# Patient Record
Sex: Female | Born: 1960 | Race: White | Hispanic: No | Marital: Single | Smoking: Never smoker
Health system: Southern US, Community
[De-identification: ages and names within clinical notes are randomized; demographics above are authoritative.]

## PROBLEM LIST (undated history)

## (undated) DIAGNOSIS — I10 Essential (primary) hypertension: Secondary | ICD-10-CM

## (undated) DIAGNOSIS — G4700 Insomnia, unspecified: Secondary | ICD-10-CM

## (undated) DIAGNOSIS — C439 Malignant melanoma of skin, unspecified: Secondary | ICD-10-CM

## (undated) HISTORY — PX: CARDIAC SURGERY: SHX584

## (undated) HISTORY — PX: OTHER SURGICAL HISTORY: SHX169

---

## 2013-01-17 DIAGNOSIS — R000 Tachycardia, unspecified: Secondary | ICD-10-CM | POA: Insufficient documentation

## 2013-01-17 DIAGNOSIS — Z79899 Other long term (current) drug therapy: Secondary | ICD-10-CM | POA: Insufficient documentation

## 2013-01-17 DIAGNOSIS — G4700 Insomnia, unspecified: Secondary | ICD-10-CM | POA: Insufficient documentation

## 2013-01-17 DIAGNOSIS — I10 Essential (primary) hypertension: Secondary | ICD-10-CM | POA: Insufficient documentation

## 2013-01-17 DIAGNOSIS — I4891 Unspecified atrial fibrillation: Secondary | ICD-10-CM | POA: Insufficient documentation

## 2013-01-17 DIAGNOSIS — F411 Generalized anxiety disorder: Secondary | ICD-10-CM | POA: Insufficient documentation

## 2013-01-17 DIAGNOSIS — Z7982 Long term (current) use of aspirin: Secondary | ICD-10-CM | POA: Insufficient documentation

## 2013-01-18 ENCOUNTER — Other Ambulatory Visit: Payer: Self-pay

## 2013-01-18 ENCOUNTER — Emergency Department (HOSPITAL_COMMUNITY): Payer: BC Managed Care – PPO

## 2013-01-18 ENCOUNTER — Encounter (HOSPITAL_COMMUNITY): Payer: Self-pay | Admitting: Emergency Medicine

## 2013-01-18 ENCOUNTER — Emergency Department (HOSPITAL_COMMUNITY)
Admission: EM | Admit: 2013-01-18 | Discharge: 2013-01-18 | Disposition: A | Discharge status: Home / Self Care | Payer: BC Managed Care – PPO | Attending: Emergency Medicine | Admitting: Emergency Medicine

## 2013-01-18 DIAGNOSIS — I480 Paroxysmal atrial fibrillation: Secondary | ICD-10-CM

## 2013-01-18 HISTORY — DX: Insomnia, unspecified: G47.00

## 2013-01-18 HISTORY — DX: Essential (primary) hypertension: I10

## 2013-01-18 LAB — CBC
HCT: 44.8 % (ref 36.0–46.0)
MCH: 32.1 pg (ref 26.0–34.0)
MCV: 90.3 fL (ref 78.0–100.0)
RBC: 4.96 MIL/uL (ref 3.87–5.11)
RDW: 13.2 % (ref 11.5–15.5)
WBC: 8.3 10*3/uL (ref 4.0–10.5)

## 2013-01-18 LAB — BASIC METABOLIC PANEL
BUN: 13 mg/dL (ref 6–23)
CO2: 25 mEq/L (ref 19–32)
Calcium: 9.7 mg/dL (ref 8.4–10.5)
Chloride: 103 mEq/L (ref 96–112)
Creatinine, Ser: 0.64 mg/dL (ref 0.50–1.10)
GFR calc Af Amer: >90 mL/min (ref >90)

## 2013-01-18 MED ORDER — DILTIAZEM LOAD VIA INFUSION
10.0000 mg | Freq: Once | INTRAVENOUS | Status: AC
  Administered 2013-01-18: 10 mg via INTRAVENOUS
  Filled 2013-01-18: qty 10

## 2013-01-18 MED ORDER — DILTIAZEM HCL 30 MG PO TABS: 30.0000 mg | ORAL_TABLET | Freq: Three times a day (TID) | ORAL | Status: AC | PRN

## 2013-01-18 MED ORDER — DEXTROSE 5 % IV SOLN
5.0000 mg/h | INTRAVENOUS | Status: DC
  Administered 2013-01-18: 10 mg/h via INTRAVENOUS

## 2013-01-18 MED ORDER — SODIUM CHLORIDE 0.9 % IV BOLUS (SEPSIS)
1000.0000 mL | Freq: Once | INTRAVENOUS | Status: AC
  Administered 2013-01-18: 1000 mL via INTRAVENOUS

## 2013-01-18 NOTE — ED Provider Notes (Signed)
CSN: [id number]     Arrival date & time 01/17/13  2358 History   First MD Initiated Contact with Patient 01/18/13 0013     Chief Complaint  Patient presents with  . 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  About 90 minutes ago, patient was running to a code when she had onset of severe shortness of breath and palpitations.  Patient had heart rate in the 170s with PACs per the monitor at the hospital.  Patient did have some shortness of breath and dizziness, which has since resolved.  She denies any chest pain.  No prior history of palpitations or racing heart rate.  She reports both father and mother have history of age of fibrillation.  Patient reports over last 3 from, what she's had problems with increasing stress levels, insomnia, and depression, she works nights, and she feels these have led to her developing  A. fib. Past Medical History  Diagnosis Date  . Hypertension   . Insomnia    History reviewed. No pertinent past surgical history. No family history on file. History  Substance Use Topics  . Smoking status: Never Smoker   . Smokeless tobacco: Not on file  . Alcohol Use: Yes     Comment: wine   OB History   Grav Para Term Preterm Abortions TAB SAB Ect Mult Living                 Review of Systems  See History of Present Illness; otherwise all other systems are reviewed and negative Allergies  Review of patient's allergies indicates no known allergies.  Home Medications   Current Outpatient Rx  Name  Route  Sig  Dispense  Refill  . ALPRAZolam (XANAX) 0.5 MG tablet   Oral   Take 0.5 mg by mouth once.         Marland Kitchen aspirin 325 MG tablet   Oral   Take 325 mg by mouth daily.         Marland Kitchen lisinopril (PRINIVIL,ZESTRIL) 20 MG tablet   Oral   Take 20 mg by  mouth daily.         . Multiple Vitamin (MULTIVITAMIN WITH MINERALS) TABS tablet   Oral   Take 1 tablet by mouth daily.          BP 105/69  Pulse 112  Temp(Src) 97.6 F (36.4 C) (Oral)  Resp 20  SpO2 100%  LMP 01/11/2013 Physical Exam  Nursing note and vitals reviewed. Constitutional: She is oriented to person, place, and time. She appears well-developed and well-nourished. She appears distressed (anxious appearing).  HENT:  Head: Normocephalic and atraumatic.  Right Ear: External ear normal.  Left Ear: External ear normal.  Nose: Nose normal.  Mouth/Throat: Oropharynx is clear and moist.  Eyes: Conjunctivae and EOM are normal. Pupils are equal, round, and reactive to light.  Neck: Normal range of motion. Neck supple. No JVD present. No tracheal deviation present. No thyromegaly present.  Cardiovascular: Normal heart sounds and intact distal pulses.  Exam reveals no gallop and no friction rub.   No murmur heard. Tachycardia, irregular rate  Pulmonary/Chest: Effort normal and breath sounds normal. No stridor. No respiratory distress. She has no wheezes. She has no rales. She exhibits no tenderness.  Abdominal: Soft. Bowel sounds are normal. She exhibits no distension and no mass. There is no tenderness. There  is no rebound and no guarding.  Musculoskeletal: Normal range of motion. She exhibits no edema and no tenderness.  Lymphadenopathy:    She has no cervical adenopathy.  Neurological: She is alert and oriented to person, place, and time. She exhibits normal muscle tone. Coordination normal.  Skin: Skin is warm and dry. No rash noted. No erythema. No pallor.  Psychiatric: She has a normal mood and affect. Her behavior is normal. Judgment and thought content normal.    ED Course  Procedures (including critical care time)  CRITICAL CARE Performed by: Olivia Mackie Total critical care time: 45 min Critical care time was exclusive of separately billable procedures and  treating other patients. Critical care was necessary to treat or prevent imminent or life-threatening deterioration. Critical care was time spent personally by me on the following activities: development of treatment plan with patient and/or surrogate as well as nursing, discussions with consultants, evaluation of patient's response to treatment, examination of patient, obtaining history from patient or surrogate, ordering and performing treatments and interventions, ordering and review of laboratory studies, ordering and review of radiographic studies, pulse oximetry and re-evaluation of patient's condition.  Labs Review Labs Reviewed  CBC - Abnormal; Notable for the following:    Hemoglobin 15.9 (*)    All other components within normal limits  BASIC METABOLIC PANEL - Abnormal; Notable for the following:    Glucose, Bld 164 (*)    All other components within normal limits  POCT I-STAT TROPONIN I   Imaging Review Dg Chest Port 1 View  01/18/2013   CLINICAL DATA:  Hypertension, tachycardia, atrial fibrillation.  EXAM: PORTABLE CHEST - 1 VIEW  COMPARISON:  None available for comparison at time of study interpretation.  FINDINGS: Cardiomediastinal silhouette is unremarkable. The lungs are clear without pleural effusions or focal consolidations. Pulmonary vasculature is unremarkable. Trachea projects midline and there is no pneumothorax. Soft tissue planes and included osseous structures are nonsuspicious. Multiple EKG lines overlie the patient and may obscure subtle underlying pathology.  IMPRESSION: No active disease.   Electronically Signed   By: Awilda Metro   On: 01/18/2013 00:34    Date: 01/18/2013  Rate: 177  Rhythm: atrial fibrillation  QRS Axis: normal  Intervals: afib  ST/T Wave abnormalities: nonspecific ST changes  Conduction Disutrbances:afib  Narrative Interpretation:   Old EKG Reviewed: none available   Date: 01/18/2013  Rate: 85  Rhythm: normal sinus rhythm  QRS Axis:  normal  Intervals: normal  ST/T Wave abnormalities: normal  Conduction Disutrbances:none  Narrative Interpretation: afib with rvr resolved.  RSR in V1,V2  Old EKG Reviewed: changes noted     EKG Interpretation   None       MDM   1. Paroxysmal a-fib    52 year old female with A. fib with RVR.  We'll start diltiazem for rate control.  Will discuss with cardiology.  2:27 AM Pt has spontaneously converted to sinus.  Will consult cardiology regarding anticoagulation recommendations ([place] score 1), and close f/u .  Olivia Mackie, MD 01/18/13 [id number]

## 2013-01-18 NOTE — ED Notes (Addendum)
C/o heart racing x 1 1/2 hours.  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  Reports sob and dizziness that has resolved.  EKG- Afib with RVR

## 2013-01-18 NOTE — ED Notes (Signed)
MD at bedside. Seen by cardiology

## 2013-02-25 DIAGNOSIS — I480 Paroxysmal atrial fibrillation: Secondary | ICD-10-CM | POA: Insufficient documentation

## 2013-02-25 DIAGNOSIS — I10 Essential (primary) hypertension: Secondary | ICD-10-CM | POA: Insufficient documentation

## 2015-03-24 DIAGNOSIS — Z8679 Personal history of other diseases of the circulatory system: Secondary | ICD-10-CM | POA: Insufficient documentation

## 2015-04-10 DIAGNOSIS — F331 Major depressive disorder, recurrent, moderate: Secondary | ICD-10-CM | POA: Insufficient documentation

## 2015-04-10 DIAGNOSIS — G4720 Circadian rhythm sleep disorder, unspecified type: Secondary | ICD-10-CM | POA: Insufficient documentation

## 2015-04-10 DIAGNOSIS — N951 Menopausal and female climacteric states: Secondary | ICD-10-CM | POA: Insufficient documentation

## 2015-08-07 DIAGNOSIS — L719 Rosacea, unspecified: Secondary | ICD-10-CM | POA: Insufficient documentation

## 2016-02-12 DIAGNOSIS — C439 Malignant melanoma of skin, unspecified: Secondary | ICD-10-CM

## 2016-02-12 HISTORY — DX: Malignant melanoma of skin, unspecified: C43.9

## 2016-05-06 DIAGNOSIS — H02402 Unspecified ptosis of left eyelid: Secondary | ICD-10-CM | POA: Insufficient documentation

## 2016-05-06 DIAGNOSIS — G902 Horner's syndrome: Secondary | ICD-10-CM | POA: Insufficient documentation

## 2017-01-13 DIAGNOSIS — C4359 Malignant melanoma of other part of trunk: Secondary | ICD-10-CM | POA: Insufficient documentation

## 2017-08-01 ENCOUNTER — Other Ambulatory Visit: Payer: Self-pay | Admitting: Obstetrics & Gynecology

## 2017-08-01 DIAGNOSIS — Z1231 Encounter for screening mammogram for malignant neoplasm of breast: Secondary | ICD-10-CM

## 2017-08-22 ENCOUNTER — Ambulatory Visit
Admission: RE | Admit: 2017-08-22 | Discharge: 2017-08-22 | Disposition: A | Discharge status: Home / Self Care | Payer: BC Managed Care – PPO | Source: Ambulatory Visit | Attending: Obstetrics & Gynecology | Admitting: Obstetrics & Gynecology

## 2017-08-22 ENCOUNTER — Encounter: Payer: Self-pay | Admitting: Radiology

## 2017-08-22 DIAGNOSIS — Z1231 Encounter for screening mammogram for malignant neoplasm of breast: Secondary | ICD-10-CM | POA: Insufficient documentation

## 2017-08-22 HISTORY — DX: Malignant melanoma of skin, unspecified: C43.9

## 2018-07-27 ENCOUNTER — Other Ambulatory Visit: Payer: Self-pay | Admitting: Pediatrics

## 2018-07-27 ENCOUNTER — Other Ambulatory Visit: Payer: Self-pay | Admitting: Obstetrics & Gynecology

## 2018-07-27 DIAGNOSIS — Z1231 Encounter for screening mammogram for malignant neoplasm of breast: Secondary | ICD-10-CM

## 2018-10-29 ENCOUNTER — Ambulatory Visit
Admission: RE | Admit: 2018-10-29 | Discharge: 2018-10-29 | Disposition: A | Discharge status: Home / Self Care | Payer: BC Managed Care – PPO | Source: Ambulatory Visit | Attending: Obstetrics & Gynecology | Admitting: Obstetrics & Gynecology

## 2018-10-29 DIAGNOSIS — Z1231 Encounter for screening mammogram for malignant neoplasm of breast: Secondary | ICD-10-CM | POA: Insufficient documentation

## 2019-10-05 ENCOUNTER — Other Ambulatory Visit: Payer: Self-pay | Admitting: Obstetrics & Gynecology

## 2019-10-05 DIAGNOSIS — Z1231 Encounter for screening mammogram for malignant neoplasm of breast: Secondary | ICD-10-CM

## 2019-11-01 ENCOUNTER — Ambulatory Visit
Admission: RE | Admit: 2019-11-01 | Discharge: 2019-11-01 | Disposition: A | Discharge status: Home / Self Care | Payer: BC Managed Care – PPO | Source: Ambulatory Visit | Attending: Obstetrics & Gynecology | Admitting: Obstetrics & Gynecology

## 2019-11-01 ENCOUNTER — Other Ambulatory Visit: Payer: Self-pay

## 2019-11-01 DIAGNOSIS — Z1231 Encounter for screening mammogram for malignant neoplasm of breast: Secondary | ICD-10-CM

## 2020-01-25 DIAGNOSIS — F411 Generalized anxiety disorder: Secondary | ICD-10-CM | POA: Insufficient documentation

## 2020-07-17 ENCOUNTER — Other Ambulatory Visit: Payer: Self-pay

## 2020-07-17 ENCOUNTER — Ambulatory Visit (INDEPENDENT_AMBULATORY_CARE_PROVIDER_SITE_OTHER): Payer: Commercial Managed Care - PPO | Admitting: Nurse Practitioner

## 2020-07-17 ENCOUNTER — Encounter (INDEPENDENT_AMBULATORY_CARE_PROVIDER_SITE_OTHER): Payer: Self-pay | Admitting: Nurse Practitioner

## 2020-07-17 VITALS — BP 143/85 | HR 69 | Resp 16 | Wt 191.0 lb

## 2020-07-17 DIAGNOSIS — I8311 Varicose veins of right lower extremity with inflammation: Secondary | ICD-10-CM | POA: Diagnosis not present

## 2020-07-17 DIAGNOSIS — I8312 Varicose veins of left lower extremity with inflammation: Secondary | ICD-10-CM

## 2020-07-17 DIAGNOSIS — I10 Essential (primary) hypertension: Secondary | ICD-10-CM | POA: Diagnosis not present

## 2020-07-25 ENCOUNTER — Other Ambulatory Visit (INDEPENDENT_AMBULATORY_CARE_PROVIDER_SITE_OTHER): Payer: Self-pay | Admitting: Nurse Practitioner

## 2020-07-25 ENCOUNTER — Encounter (INDEPENDENT_AMBULATORY_CARE_PROVIDER_SITE_OTHER): Payer: Self-pay | Admitting: Nurse Practitioner

## 2020-07-25 DIAGNOSIS — R600 Localized edema: Secondary | ICD-10-CM

## 2020-07-26 ENCOUNTER — Ambulatory Visit (INDEPENDENT_AMBULATORY_CARE_PROVIDER_SITE_OTHER): Payer: Commercial Managed Care - PPO | Admitting: Nurse Practitioner

## 2020-07-26 ENCOUNTER — Other Ambulatory Visit: Payer: Self-pay

## 2020-07-26 ENCOUNTER — Ambulatory Visit (INDEPENDENT_AMBULATORY_CARE_PROVIDER_SITE_OTHER): Payer: Commercial Managed Care - PPO

## 2020-07-26 ENCOUNTER — Encounter (INDEPENDENT_AMBULATORY_CARE_PROVIDER_SITE_OTHER): Payer: Self-pay | Admitting: Nurse Practitioner

## 2020-07-26 VITALS — BP 144/79 | HR 68 | Resp 16 | Wt 185.0 lb

## 2020-07-26 DIAGNOSIS — R600 Localized edema: Secondary | ICD-10-CM | POA: Diagnosis not present

## 2020-07-26 DIAGNOSIS — I8312 Varicose veins of left lower extremity with inflammation: Secondary | ICD-10-CM

## 2020-07-26 DIAGNOSIS — I10 Essential (primary) hypertension: Secondary | ICD-10-CM | POA: Diagnosis not present

## 2020-07-26 DIAGNOSIS — I8311 Varicose veins of right lower extremity with inflammation: Secondary | ICD-10-CM

## 2020-07-26 NOTE — Progress Notes (Signed)
Subjective:    Patient ID: Amber Peters, female    DOB: 10/31/1960, 60 y.o.   MRN: [id number] Chief Complaint  Patient presents with   New Patient (Initial Visit)    Ref White ble edema     The patient is seen for evaluation of symptomatic varicose veins.  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  The patient relates burning and stinging which worsened steadily throughout the course of the day, particularly with standing. The patient also notes an aching and throbbing pain over the varicosities, particularly with prolonged dependent positions. The symptoms are significantly improved with elevation.  The patient also notes that during hot weather the symptoms are greatly intensified. The patient states the pain from the varicose veins interferes with work, daily exercise, shopping and household maintenance. At this point, the symptoms are persistent and severe enough that they're having a negative impact on lifestyle and are interfering with daily activities.  There is no history of DVT, PE or superficial thrombophlebitis. There is no history of ulceration or hemorrhage. The patient denies a significant family history of varicose veins.   The patient has worn graduated compression for years and continues to use them nightly. At the present time the patient has been using over-the-counter analgesics. There is no history of prior surgical intervention or sclerotherapy.      Review of Systems  Cardiovascular:  Positive for leg swelling.  All other systems reviewed and are negative.     Objective:   Physical Exam Vitals reviewed.  HENT:     Head: Normocephalic.  Cardiovascular:     Rate and Rhythm: Normal rate.     Pulses: Normal pulses.  Pulmonary:     Effort: Pulmonary effort is normal.  Musculoskeletal:        General: No tenderness.  Skin:    General: Skin is warm and dry.  Neurological:     Mental Status: She is alert and  oriented to person, place, and time.  Psychiatric:        Mood and Affect: Mood normal.        Behavior: Behavior normal.        Thought Content: Thought content normal.        Judgment: Judgment normal.    BP (!) 143/85 (BP Location: Right Arm)   Pulse 69   Resp 16   Wt 191 lb (86.6 kg)   LMP 01/11/2013   Past Medical History:  Diagnosis Date   Hypertension    Insomnia    Melanoma ([place]) 2018   melanoma    Social History   Socioeconomic History   Marital status: Single    Spouse name: Not on file   Number of children: Not on file   Years of education: Not on file   Highest education level: Not on file  Occupational History   Not on file  Tobacco Use   Smoking status: Never   Smokeless tobacco: Never  Substance and Sexual Activity   Alcohol use: Yes    Comment: wine   Drug use: No   Sexual activity: Not on file  Other Topics Concern   Not on file  Social History Narrative   Not on file   Social Determinants of Health   Financial Resource Strain: Not on file  Food Insecurity: Not on file  Transportation Needs: Not on file  Physical Activity: Not on file  Stress: Not on file  Social Connections: Not on file  Intimate Partner Violence: Not on file    Past Surgical History:  Procedure Laterality Date   CARDIAC SURGERY     cardiac ablation   myeloma     removal    Family History  Problem Relation Age of Onset   Stroke Mother    Parkinson's disease Mother    Hypertension Father    Varicose Veins Maternal Grandmother     Allergies  Allergen Reactions   Amoxicillin Anaphylaxis    CBC Latest Ref Rng & Units 01/18/2013  WBC 4.0 - 10.5 K/uL 8.3  Hemoglobin 12.0 - 15.0 g/dL 15.9(H)  Hematocrit 36.0 - 46.0 % 44.8  Platelets 150 - 400 K/uL 262      CMP     Component Value Date/Time   NA 139 01/18/2013 0020   K 4.1 01/18/2013 0020   CL 103 01/18/2013 0020   CO2 25 01/18/2013 0020   GLUCOSE 164 (H) 01/18/2013 0020   BUN 13 01/18/2013 0020    CREATININE 0.64 01/18/2013 0020   CALCIUM 9.7 01/18/2013 0020   GFRNONAA >90 01/18/2013 0020   GFRAA >90 01/18/2013 0020     No results found.     Assessment & Plan:   1. Varicose veins of both lower extremities with inflammation  Recommend:  The patient has large symptomatic varicose veins that are painful and associated with swelling.  I have had a long discussion with the patient regarding  varicose veins and why they cause symptoms.  Patient will continue wearing graduated compression stockings class 1 on a daily basis, beginning first thing in the morning and removing them in the evening. The patient is instructed specifically not to sleep in the stockings.    The patient  will also continue using over-the-counter analgesics such as Motrin 600 mg po TID to help control the symptoms.    In addition, behavioral modification including elevation during the day will be continued.    An ultrasound will be obtained  Further plans will be based on the ultrasound results and whether conservative therapies are successful at eliminating the pain and swelling.    2. Essential hypertension Continue antihypertensive medications as already ordered, these medications have been reviewed and there are no changes at this time.    Current Outpatient Medications on File Prior to Visit  Medication Sig Dispense Refill   Aspirin 81 MG CAPS Take 81 mg by mouth daily.     diltiazem (TIAZAC) 240 MG 24 hr capsule Take 1 capsule by mouth daily.     DULoxetine (CYMBALTA) 60 MG capsule Take 1 capsule by mouth daily.     Multiple Vitamin (MULTIVITAMIN WITH MINERALS) TABS tablet Take 1 tablet by mouth daily.     traZODone (DESYREL) 100 MG tablet Take 200 mg by mouth at bedtime.     ALPRAZolam (XANAX) 0.5 MG tablet Take 0.5 mg by mouth once. (Patient not taking: Reported on 07/17/2020)     diltiazem (CARDIZEM) 30 MG tablet Take 1 tablet (30 mg total) by mouth every 8 (eight) hours as needed (palpitations).  (Patient not taking: No sig reported) 30 tablet 0   lisinopril (PRINIVIL,ZESTRIL) 20 MG tablet Take 20 mg by mouth daily. (Patient not taking: No sig reported)     No current facility-administered medications on file prior to visit.    There are no Patient Instructions on file for this visit. No follow-ups on file.   Kris Hartmann, NP

## 2020-07-31 NOTE — Progress Notes (Signed)
Subjective:    Patient ID: Amber Peters, female    DOB: 12-18-1960, 60 y.o.   MRN: [id number] Chief Complaint  Patient presents with   Follow-up    Ultrasound follow up    Amber Peters is a 60 year old female that returns today for noninvasive studies.  The patient continues to have pain in the lower extremities with dependency. The pain is lessened with elevation. Graduated compression stockings, Class I (20-30 mmHg), have been worn but the stockings do not eliminate the leg pain.  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  These are no longer controlling her symptoms.  Over-the-counter analgesics do not improve the symptoms. The degree of discomfort continues to interfere with daily activities. The patient notes the pain in the legs is causing problems with daily exercise, at the workplace and even with household activities and maintenance such as standing in the kitchen preparing meals and doing dishes.   Venous ultrasound shows deep venous insufficiency in the left lower extremity no evidence of acute or chronic DVT.  Superficial reflux is present in the right great saphenous vein as well as the left great saphenous vein   Review of Systems  Cardiovascular:  Positive for leg swelling.  All other systems reviewed and are negative.     Objective:   Physical Exam Vitals reviewed.  HENT:     Head: Normocephalic.  Cardiovascular:     Rate and Rhythm: Normal rate.     Pulses: Normal pulses.  Pulmonary:     Effort: Pulmonary effort is normal.  Musculoskeletal:        General: Tenderness present.  Skin:    General: Skin is warm and dry.     Comments: Large palpable right lower extremity varicosity  Neurological:     Mental Status: She is alert and oriented to person, place, and time.  Psychiatric:        Mood and Affect: Mood normal.        Behavior: Behavior normal.        Thought  Content: Thought content normal.        Judgment: Judgment normal.    BP (!) 144/79 (BP Location: Right Arm)   Pulse 68   Resp 16   Wt 185 lb (83.9 kg)   LMP 01/11/2013   Past Medical History:  Diagnosis Date   Hypertension    Insomnia    Melanoma ([place]) 2018   melanoma    Social History   Socioeconomic History   Marital status: Single    Spouse name: Not on file   Number of children: Not on file   Years of education: Not on file   Highest education level: Not on file  Occupational History   Not on file  Tobacco Use   Smoking status: Never   Smokeless tobacco: Never  Substance and Sexual Activity   Alcohol use: Yes    Comment: wine   Drug use: No   Sexual activity: Not on file  Other Topics Concern   Not on file  Social History Narrative   Not on file   Social Determinants of Health   Financial Resource Strain: Not on file  Food Insecurity: Not on file  Transportation Needs: Not on file  Physical Activity: Not on file  Stress: Not on file  Social Connections: Not on file  Intimate Partner Violence: Not on file    Past Surgical History:  Procedure  Laterality Date   CARDIAC SURGERY     cardiac ablation   myeloma     removal    Family History  Problem Relation Age of Onset   Stroke Mother    Parkinson's disease Mother    Hypertension Father    Varicose Veins Maternal Grandmother     Allergies  Allergen Reactions   Amoxicillin Anaphylaxis    CBC Latest Ref Rng & Units 01/18/2013  WBC 4.0 - 10.5 K/uL 8.3  Hemoglobin 12.0 - 15.0 g/dL 15.9(H)  Hematocrit 36.0 - 46.0 % 44.8  Platelets 150 - 400 K/uL 262      CMP     Component Value Date/Time   NA 139 01/18/2013 0020   K 4.1 01/18/2013 0020   CL 103 01/18/2013 0020   CO2 25 01/18/2013 0020   GLUCOSE 164 (H) 01/18/2013 0020   BUN 13 01/18/2013 0020   CREATININE 0.64 01/18/2013 0020   CALCIUM 9.7 01/18/2013 0020   GFRNONAA >90 01/18/2013 0020   GFRAA >90 01/18/2013 0020     No  results found.     Assessment & Plan:   1. Varicose veins of both lower extremities with inflammation Recommend  I have reviewed my previous  discussion with the patient regarding  varicose veins and why they cause symptoms. Patient will continue  wearing graduated compression stockings class 1 on a daily basis, beginning first thing in the morning and removing them in the evening.    In addition, behavioral modification including elevation during the day was again discussed and this will continue.  The patient has utilized over the counter pain medications and has been exercising.  However, at this time conservative therapy has not alleviated the patient's symptoms of leg pain and swelling  Recommend: laser ablation of the right and  left great saphenous veins to eliminate the symptoms of pain and swelling of the lower extremities caused by the severe superficial venous reflux disease.    2. Essential hypertension Continue antihypertensive medications as already ordered, these medications have been reviewed and there are no changes at this time.    Current Outpatient Medications on File Prior to Visit  Medication Sig Dispense Refill   Aspirin 81 MG CAPS Take 81 mg by mouth daily.     diltiazem (TIAZAC) 240 MG 24 hr capsule Take 1 capsule by mouth daily.     DULoxetine (CYMBALTA) 60 MG capsule Take 1 capsule by mouth daily.     Multiple Vitamin (MULTIVITAMIN WITH MINERALS) TABS tablet Take 1 tablet by mouth daily.     traZODone (DESYREL) 100 MG tablet Take 200 mg by mouth at bedtime.     ALPRAZolam (XANAX) 0.5 MG tablet Take 0.5 mg by mouth once. (Patient not taking: No sig reported)     diltiazem (CARDIZEM) 30 MG tablet Take 1 tablet (30 mg total) by mouth every 8 (eight) hours as needed (palpitations). (Patient not taking: No sig reported) 30 tablet 0   lisinopril (PRINIVIL,ZESTRIL) 20 MG tablet Take 20 mg by mouth daily. (Patient not taking: No sig reported)     No current  facility-administered medications on file prior to visit.    There are no Patient Instructions on file for this visit. No follow-ups on file.   Kris Hartmann, NP

## 2020-08-01 ENCOUNTER — Encounter (INDEPENDENT_AMBULATORY_CARE_PROVIDER_SITE_OTHER): Payer: Self-pay | Admitting: Nurse Practitioner

## 2020-10-30 ENCOUNTER — Other Ambulatory Visit (INDEPENDENT_AMBULATORY_CARE_PROVIDER_SITE_OTHER): Payer: Self-pay | Admitting: Nurse Practitioner

## 2020-10-30 MED ORDER — ALPRAZOLAM 0.5 MG PO TABS: 0.5000 mg | ORAL_TABLET | Freq: Two times a day (BID) | ORAL | 0 refills | Status: AC

## 2020-10-31 ENCOUNTER — Other Ambulatory Visit: Payer: Self-pay

## 2020-10-31 ENCOUNTER — Ambulatory Visit (INDEPENDENT_AMBULATORY_CARE_PROVIDER_SITE_OTHER): Payer: Commercial Managed Care - PPO | Admitting: Vascular Surgery

## 2020-10-31 ENCOUNTER — Encounter (INDEPENDENT_AMBULATORY_CARE_PROVIDER_SITE_OTHER): Payer: Self-pay | Admitting: Vascular Surgery

## 2020-10-31 DIAGNOSIS — I83813 Varicose veins of bilateral lower extremities with pain: Secondary | ICD-10-CM

## 2020-10-31 NOTE — Progress Notes (Signed)
  Amber Peters is a 60 y.o. female who presents with symptomatic venous reflux  Past Medical History:  Diagnosis Date   Hypertension    Insomnia    Melanoma ([place]) 2018   melanoma    Past Surgical History:  Procedure Laterality Date   CARDIAC SURGERY     cardiac ablation   myeloma     removal     Current Outpatient Medications:    ALPRAZolam (XANAX) 0.5 MG tablet, Take 1 tablet (0.5 mg total) by mouth 2 (two) times daily. Take 1 tablet one hour prior to appointment time.  Take second tablet when you arrive in office, Disp: 2 tablet, Rfl: 0   Aspirin 81 MG CAPS, Take 81 mg by mouth daily., Disp: , Rfl:    diltiazem (CARDIZEM) 30 MG tablet, Take 1 tablet (30 mg total) by mouth every 8 (eight) hours as needed (palpitations)., Disp: 30 tablet, Rfl: 0   diltiazem (TIAZAC) 240 MG 24 hr capsule, Take 1 capsule by mouth daily., Disp: , Rfl:    DULoxetine (CYMBALTA) 60 MG capsule, Take 1 capsule by mouth daily., Disp: , Rfl:    Multiple Vitamin (MULTIVITAMIN WITH MINERALS) TABS tablet, Take 1 tablet by mouth daily., Disp: , Rfl:    traZODone (DESYREL) 100 MG tablet, Take 200 mg by mouth at bedtime., Disp: , Rfl:    lisinopril (PRINIVIL,ZESTRIL) 20 MG tablet, Take 20 mg by mouth daily. (Patient not taking: No sig reported), Disp: , Rfl:   Allergies  Allergen Reactions   Amoxicillin Anaphylaxis     Varicose veins of leg with pain, bilateral     PLAN: The patient's right lower extremity was sterilely prepped and draped. The ultrasound machine was used to visualize the saphenous vein throughout its course. A segment just below the knee was selected for access. The saphenous vein was accessed without difficulty using ultrasound guidance with a micropuncture needle. A 0.018 wire was then placed beyond the saphenofemoral junction and the needle was removed. The 65 cm sheath was then placed over the wire and the wire and dilator were removed. The laser fiber was then placed through the sheath  and its tip was placed approximately 4-5 centimeters below the saphenofemoral junction. Tumescent anesthesia was then created with a dilute lidocaine solution. Laser energy was then delivered with constant withdrawal of the sheath and laser fiber. Approximately 1266 joules of energy were delivered over a length of 31 centimeters using a 1470 Hz VenaCure machine at 7 W. Sterile dressings were placed. The patient tolerated the procedure well without obvious complications.   Follow-up in 1 week with post-laser duplex.

## 2020-11-03 ENCOUNTER — Telehealth (INDEPENDENT_AMBULATORY_CARE_PROVIDER_SITE_OTHER): Payer: Self-pay

## 2020-11-03 NOTE — Telephone Encounter (Signed)
Pt called and wants to know how long since her laser ablation done one Tuesday last week will the vein in her leg reseed she is having a lot of pain I spoke to the pt and made her aware that per Dr. Lucky Cowboy this can last about 3-4 week and is to be expected  and that a knot and tenderness is normal . I also made the pt aware that compression stocking to the knee were ok that to the thigh were no necessary .

## 2020-11-07 ENCOUNTER — Ambulatory Visit (INDEPENDENT_AMBULATORY_CARE_PROVIDER_SITE_OTHER): Payer: Commercial Managed Care - PPO

## 2020-11-07 ENCOUNTER — Other Ambulatory Visit: Payer: Self-pay

## 2020-11-07 DIAGNOSIS — I83813 Varicose veins of bilateral lower extremities with pain: Secondary | ICD-10-CM

## 2020-11-21 ENCOUNTER — Encounter (INDEPENDENT_AMBULATORY_CARE_PROVIDER_SITE_OTHER): Payer: Self-pay | Admitting: Nurse Practitioner

## 2020-11-21 ENCOUNTER — Ambulatory Visit (INDEPENDENT_AMBULATORY_CARE_PROVIDER_SITE_OTHER): Payer: Commercial Managed Care - PPO | Admitting: Nurse Practitioner

## 2020-11-21 ENCOUNTER — Other Ambulatory Visit: Payer: Self-pay

## 2020-11-21 ENCOUNTER — Ambulatory Visit (INDEPENDENT_AMBULATORY_CARE_PROVIDER_SITE_OTHER): Payer: Commercial Managed Care - PPO | Admitting: Vascular Surgery

## 2020-11-21 VITALS — BP 155/85 | HR 69 | Resp 16 | Wt 186.4 lb

## 2020-11-21 DIAGNOSIS — I83813 Varicose veins of bilateral lower extremities with pain: Secondary | ICD-10-CM | POA: Diagnosis not present

## 2020-11-21 DIAGNOSIS — I10 Essential (primary) hypertension: Secondary | ICD-10-CM

## 2020-11-22 ENCOUNTER — Telehealth (INDEPENDENT_AMBULATORY_CARE_PROVIDER_SITE_OTHER): Payer: Self-pay | Admitting: Vascular Surgery

## 2020-11-22 NOTE — Telephone Encounter (Signed)
documentation

## 2020-11-29 ENCOUNTER — Encounter (INDEPENDENT_AMBULATORY_CARE_PROVIDER_SITE_OTHER): Payer: Self-pay | Admitting: Nurse Practitioner

## 2020-11-29 NOTE — Progress Notes (Signed)
Subjective:    Patient ID: Amber Peters, female    DOB: [birth year], 60 y.o.   MRN: [id number] Chief Complaint  Patient presents with   Follow-up    4 wk post laser follow up    The patient returns to the office for followup status post laser ablation of the right great saphenous vein on 10/31/2020. The patient notes multiple residual varicosities bilaterally which continued to hurt with dependent positions and remained tender to palpation. The patient's swelling is unchanged from preoperative status. The patient continues to wear graduated compression stockings on a daily basis but these are not eliminating the pain and discomfort. The patient continues to use over-the-counter anti-inflammatory medications to treat the pain and related symptoms but this has not given the patient relief. The patient notes the pain in the lower extremities is causing problems with daily exercise, problems at work and even with household activities such as preparing meals and doing dishes.  The patient is otherwise done well and there have been no complications related to the laser procedure or interval changes in the patient's overall   Venous ultrasound post laser shows successful laser ablation of the right great saphenous vein, no DVT identified.    Review of Systems  Hematological:  Bruises/bleeds easily.  All other systems reviewed and are negative.     Objective:   Physical Exam Vitals reviewed.  HENT:     Head: Normocephalic.  Cardiovascular:     Rate and Rhythm: Normal rate.  Pulmonary:     Effort: Pulmonary effort is normal.  Musculoskeletal:        General: Tenderness present.  Skin:    General: Skin is warm and dry.  Neurological:     Mental Status: She is alert and oriented to person, place, and time.  Psychiatric:        Mood and Affect: Mood normal.        Behavior: Behavior normal.        Thought Content: Thought content normal.        Judgment: Judgment normal.    BP (!)  155/85 (BP Location: Right Arm)   Pulse 69   Resp 16   Wt 186 lb 6.4 oz (84.6 kg)   LMP 01/11/2013   Past Medical History:  Diagnosis Date   Hypertension    Insomnia    Melanoma ([place]) 2018   melanoma    Social History   Socioeconomic History   Marital status: Single    Spouse name: Not on file   Number of children: Not on file   Years of education: Not on file   Highest education level: Not on file  Occupational History   Not on file  Tobacco Use   Smoking status: Never   Smokeless tobacco: Never  Substance and Sexual Activity   Alcohol use: Yes    Comment: wine   Drug use: No   Sexual activity: Not on file  Other Topics Concern   Not on file  Social History Narrative   Not on file   Social Determinants of Health   Financial Resource Strain: Not on file  Food Insecurity: Not on file  Transportation Needs: Not on file  Physical Activity: Not on file  Stress: Not on file  Social Connections: Not on file  Intimate Partner Violence: Not on file    Past Surgical History:  Procedure Laterality Date   CARDIAC SURGERY     cardiac ablation   myeloma     removal  Family History  Problem Relation Age of Onset   Stroke Mother    Parkinson's disease Mother    Hypertension Father    Varicose Veins Maternal Grandmother     Allergies  Allergen Reactions   Amoxicillin Anaphylaxis    CBC Latest Ref Rng & Units 01/18/2013  WBC 4.0 - 10.5 K/uL 8.3  Hemoglobin 12.0 - 15.0 g/dL 15.9(H)  Hematocrit 36.0 - 46.0 % 44.8  Platelets 150 - 400 K/uL 262      CMP     Component Value Date/Time   NA 139 01/18/2013 0020   K 4.1 01/18/2013 0020   CL 103 01/18/2013 0020   CO2 25 01/18/2013 0020   GLUCOSE 164 (H) 01/18/2013 0020   BUN 13 01/18/2013 0020   CREATININE 0.64 01/18/2013 0020   CALCIUM 9.7 01/18/2013 0020   GFRNONAA >90 01/18/2013 0020   GFRAA >90 01/18/2013 0020     No results found.     Assessment & Plan:   1. Varicose veins of leg with  pain, bilateral The patient still has approval for a left lower extremity endovenous laser ablation.  We will proceed with getting that scheduled.  Following ablation above we will proceed with evaluation for possible sclerotherapy for both lower extremities.  2. Essential hypertension Continue antihypertensive medications as already ordered, these medications have been reviewed and there are no changes at this time.    Current Outpatient Medications on File Prior to Visit  Medication Sig Dispense Refill   diltiazem (CARDIZEM) 30 MG tablet Take 1 tablet (30 mg total) by mouth every 8 (eight) hours as needed (palpitations). 30 tablet 0   Multiple Vitamin (MULTIVITAMIN WITH MINERALS) TABS tablet Take 1 tablet by mouth daily.     traZODone (DESYREL) 100 MG tablet Take 200 mg by mouth at bedtime.     ALPRAZolam (XANAX) 0.5 MG tablet Take 1 tablet (0.5 mg total) by mouth 2 (two) times daily. Take 1 tablet one hour prior to appointment time.  Take second tablet when you arrive in office (Patient not taking: Reported on 11/21/2020) 2 tablet 0   Aspirin 81 MG CAPS Take 81 mg by mouth daily. (Patient not taking: Reported on 11/21/2020)     diltiazem (TIAZAC) 240 MG 24 hr capsule Take 1 capsule by mouth daily. (Patient not taking: Reported on 11/21/2020)     DULoxetine (CYMBALTA) 60 MG capsule Take 1 capsule by mouth daily. (Patient not taking: Reported on 11/21/2020)     lisinopril (PRINIVIL,ZESTRIL) 20 MG tablet Take 20 mg by mouth daily. (Patient not taking: No sig reported)     No current facility-administered medications on file prior to visit.    There are no Patient Instructions on file for this visit. No follow-ups on file.   Kris Hartmann, NP

## 2020-12-15 ENCOUNTER — Encounter (INDEPENDENT_AMBULATORY_CARE_PROVIDER_SITE_OTHER): Payer: Self-pay | Admitting: Vascular Surgery

## 2020-12-15 ENCOUNTER — Other Ambulatory Visit: Payer: Self-pay

## 2020-12-15 ENCOUNTER — Ambulatory Visit (INDEPENDENT_AMBULATORY_CARE_PROVIDER_SITE_OTHER): Payer: Commercial Managed Care - PPO | Admitting: Vascular Surgery

## 2020-12-15 VITALS — BP 138/83 | HR 76 | Ht 66.0 in | Wt 190.0 lb

## 2020-12-15 DIAGNOSIS — I83813 Varicose veins of bilateral lower extremities with pain: Secondary | ICD-10-CM | POA: Diagnosis not present

## 2020-12-15 NOTE — Progress Notes (Signed)
  Amber Peters is a 60 y.o. female who presents with symptomatic venous reflux  Past Medical History:  Diagnosis Date   Hypertension    Insomnia    Melanoma ([place]) 2018   melanoma    Past Surgical History:  Procedure Laterality Date   CARDIAC SURGERY     cardiac ablation   myeloma     removal     Current Outpatient Medications:    ALPRAZolam (XANAX) 0.5 MG tablet, Take 1 tablet (0.5 mg total) by mouth 2 (two) times daily. Take 1 tablet one hour prior to appointment time.  Take second tablet when you arrive in office (Patient not taking: Reported on 11/21/2020), Disp: 2 tablet, Rfl: 0   Aspirin 81 MG CAPS, Take 81 mg by mouth daily. (Patient not taking: Reported on 11/21/2020), Disp: , Rfl:    diltiazem (CARDIZEM) 30 MG tablet, Take 1 tablet (30 mg total) by mouth every 8 (eight) hours as needed (palpitations)., Disp: 30 tablet, Rfl: 0   diltiazem (TIAZAC) 240 MG 24 hr capsule, Take 1 capsule by mouth daily. (Patient not taking: Reported on 11/21/2020), Disp: , Rfl:    DULoxetine (CYMBALTA) 60 MG capsule, Take 1 capsule by mouth daily. (Patient not taking: Reported on 11/21/2020), Disp: , Rfl:    lisinopril (PRINIVIL,ZESTRIL) 20 MG tablet, Take 20 mg by mouth daily. (Patient not taking: No sig reported), Disp: , Rfl:    Multiple Vitamin (MULTIVITAMIN WITH MINERALS) TABS tablet, Take 1 tablet by mouth daily., Disp: , Rfl:    traZODone (DESYREL) 100 MG tablet, Take 200 mg by mouth at bedtime., Disp: , Rfl:   Allergies  Allergen Reactions   Amoxicillin Anaphylaxis     Varicose veins of leg with pain, bilateral   PLAN: The patient's left lower extremity was sterilely prepped and draped. The ultrasound machine was used to visualize the saphenous vein throughout its course. A segment in the upper calf was selected for access. The saphenous vein was accessed without difficulty using ultrasound guidance with a micropuncture needle. A 0.018 wire was then placed beyond the saphenofemoral  junction and the needle was removed. The 65 cm sheath was then placed over the wire and the wire and dilator were removed. The laser fiber was then placed through the sheath and its tip was placed approximately 5 centimeters below the saphenofemoral junction. Tumescent anesthesia was then created with a dilute lidocaine solution. Laser energy was then delivered with constant withdrawal of the sheath and laser fiber. Approximately 1302 joules of energy were delivered over a length of 33 centimeters using a 1470 Hz VenaCure machine at 7 W. Sterile dressings were placed. The patient tolerated the procedure well without obvious complications.   Follow-up in 1 week with post-laser duplex.

## 2020-12-25 ENCOUNTER — Ambulatory Visit (INDEPENDENT_AMBULATORY_CARE_PROVIDER_SITE_OTHER): Payer: Commercial Managed Care - PPO

## 2020-12-25 ENCOUNTER — Other Ambulatory Visit: Payer: Self-pay

## 2020-12-25 ENCOUNTER — Other Ambulatory Visit (INDEPENDENT_AMBULATORY_CARE_PROVIDER_SITE_OTHER): Payer: Self-pay | Admitting: Vascular Surgery

## 2020-12-25 DIAGNOSIS — I872 Venous insufficiency (chronic) (peripheral): Secondary | ICD-10-CM

## 2021-01-15 ENCOUNTER — Ambulatory Visit (INDEPENDENT_AMBULATORY_CARE_PROVIDER_SITE_OTHER): Payer: Commercial Managed Care - PPO | Admitting: Nurse Practitioner

## 2021-02-22 IMAGING — MG MM DIGITAL SCREENING BILAT W/ TOMO W/ CAD
6 of 10 series · 6 of 30 positions shown · non-contrast
Comparison: Previous exam(s).

CLINICAL DATA: Screening.

EXAM:
DIGITAL SCREENING BILATERAL MAMMOGRAM WITH TOMO AND CAD

[R MLO synth-2D (1 of 2)]
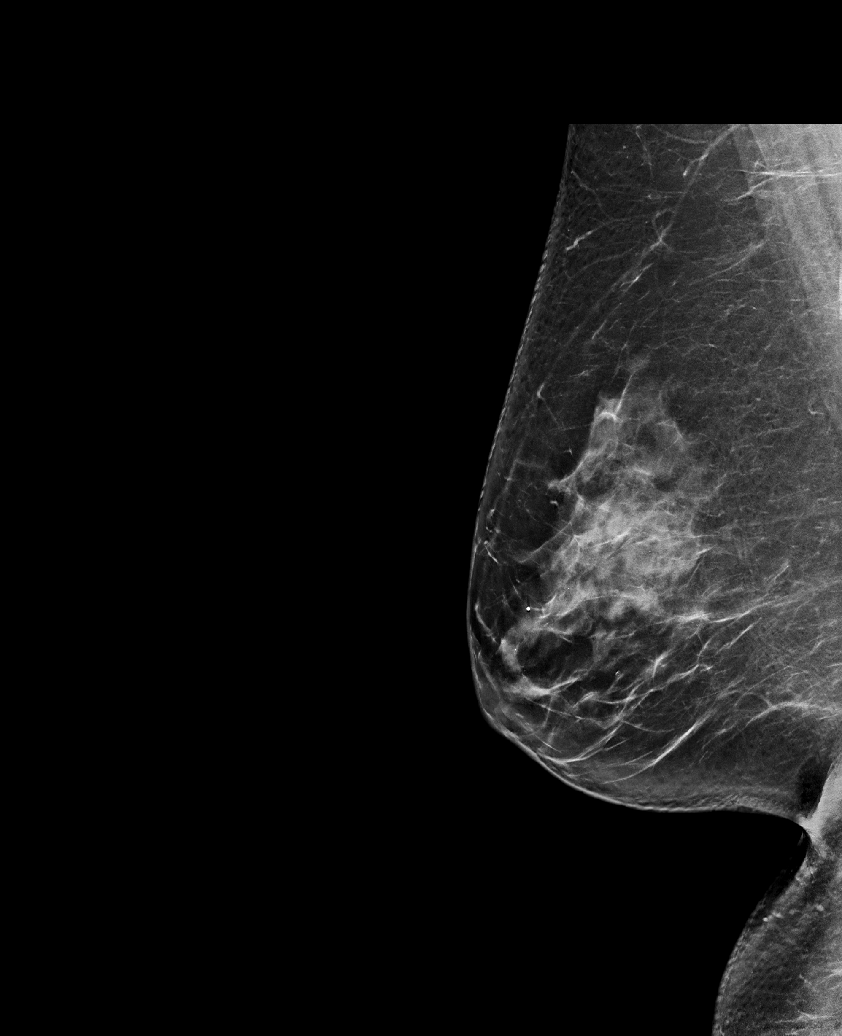

[L CC synth-2D]
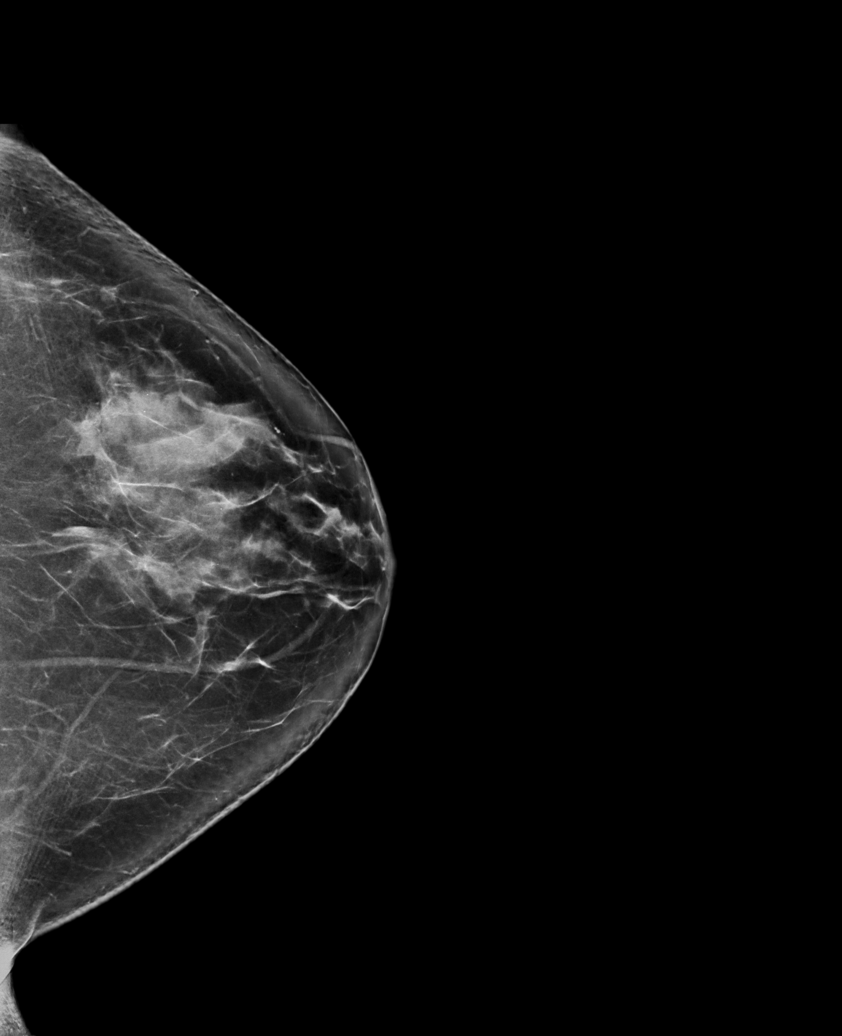

[L MLO synth-2D]
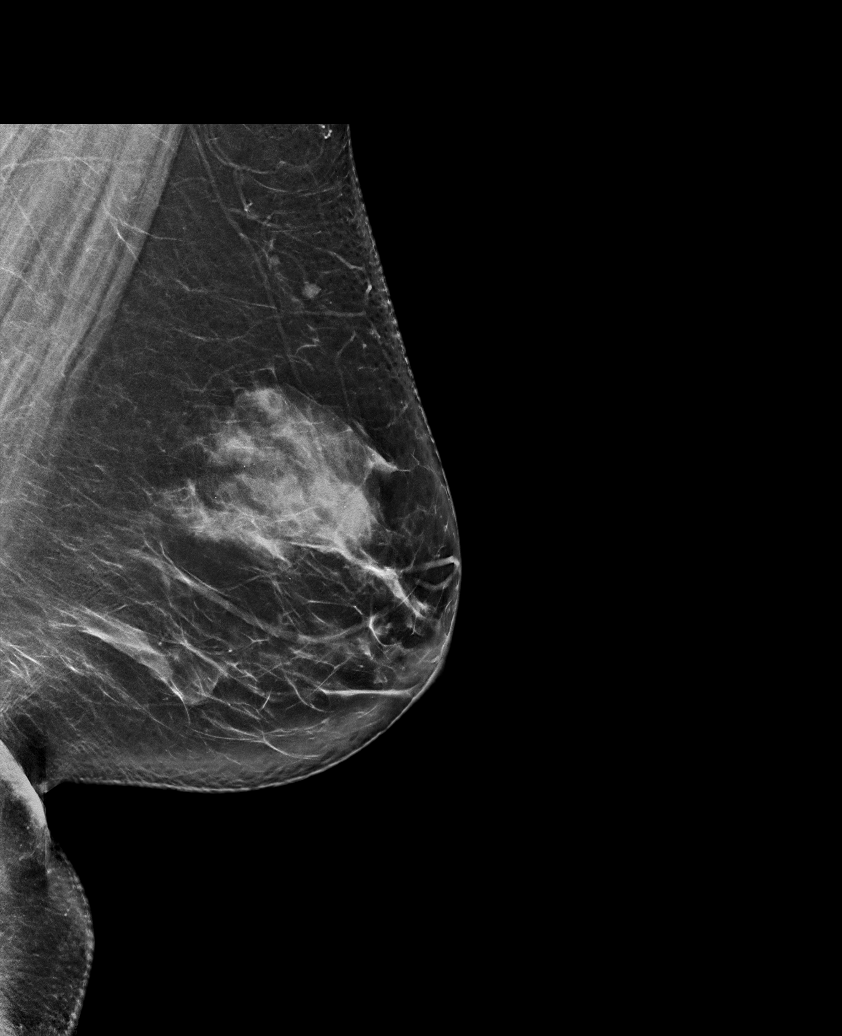

[R CC synth-2D]
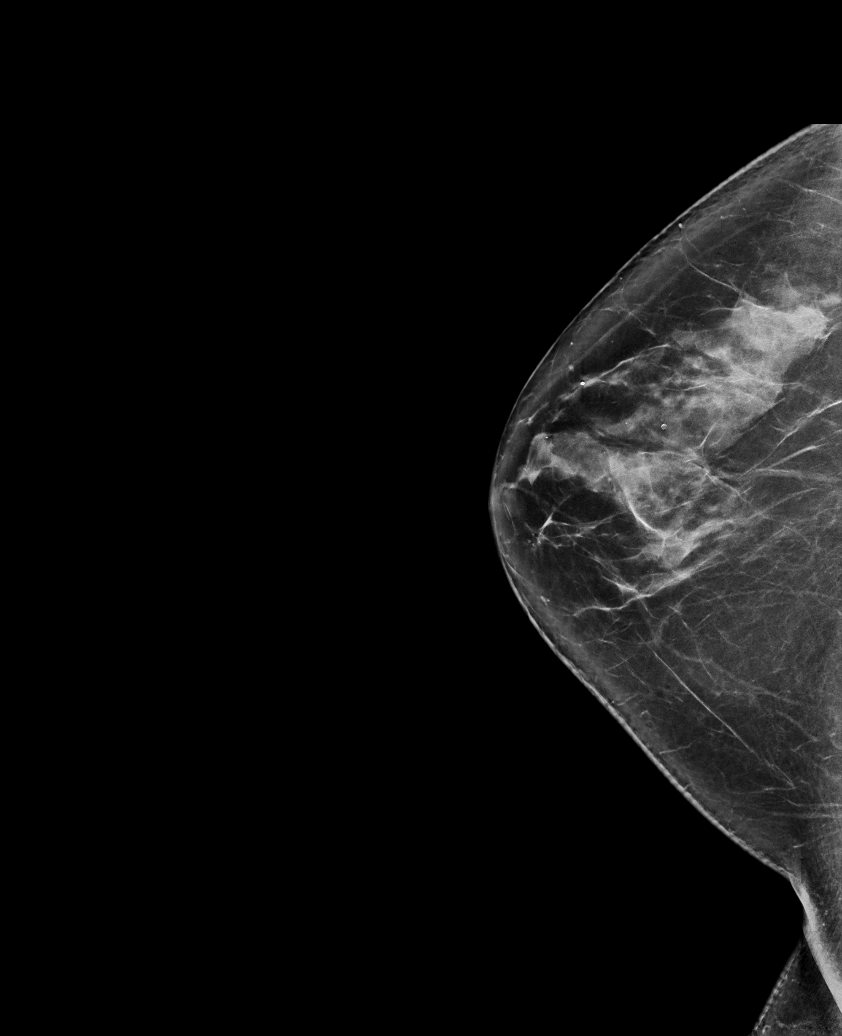

[R MLO synth-2D (2 of 2)]
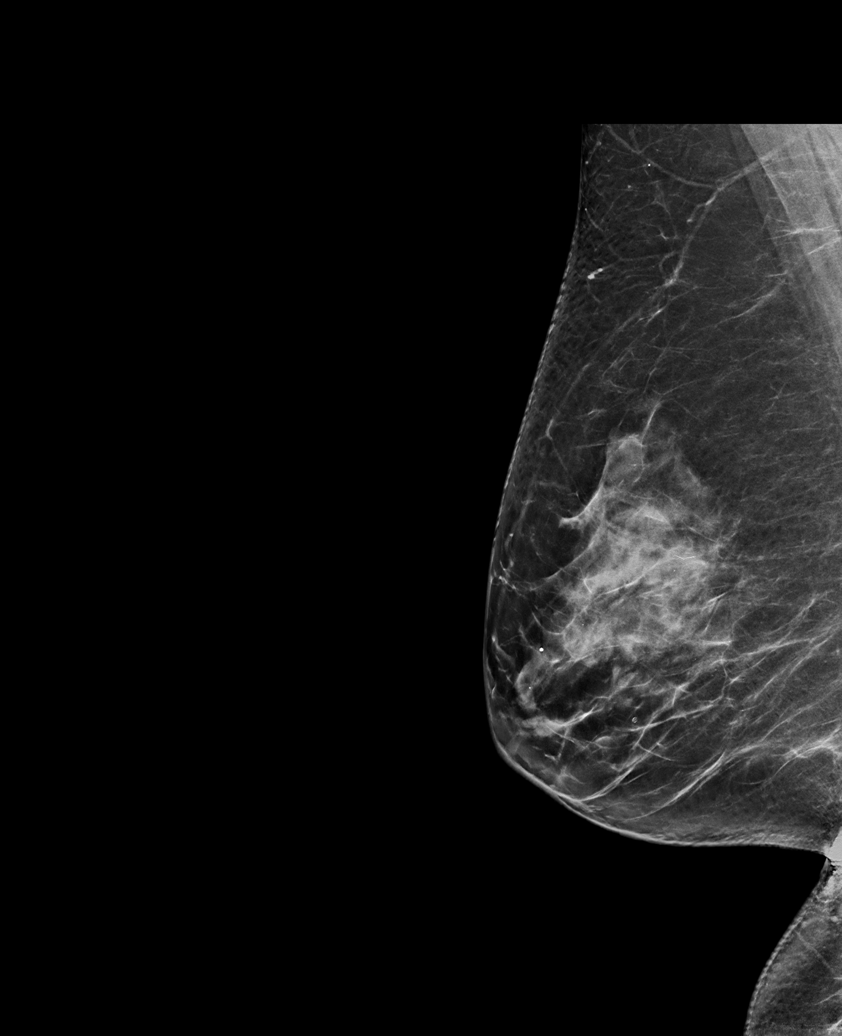

[L MLO tomo · tomo slice 45/90.0]
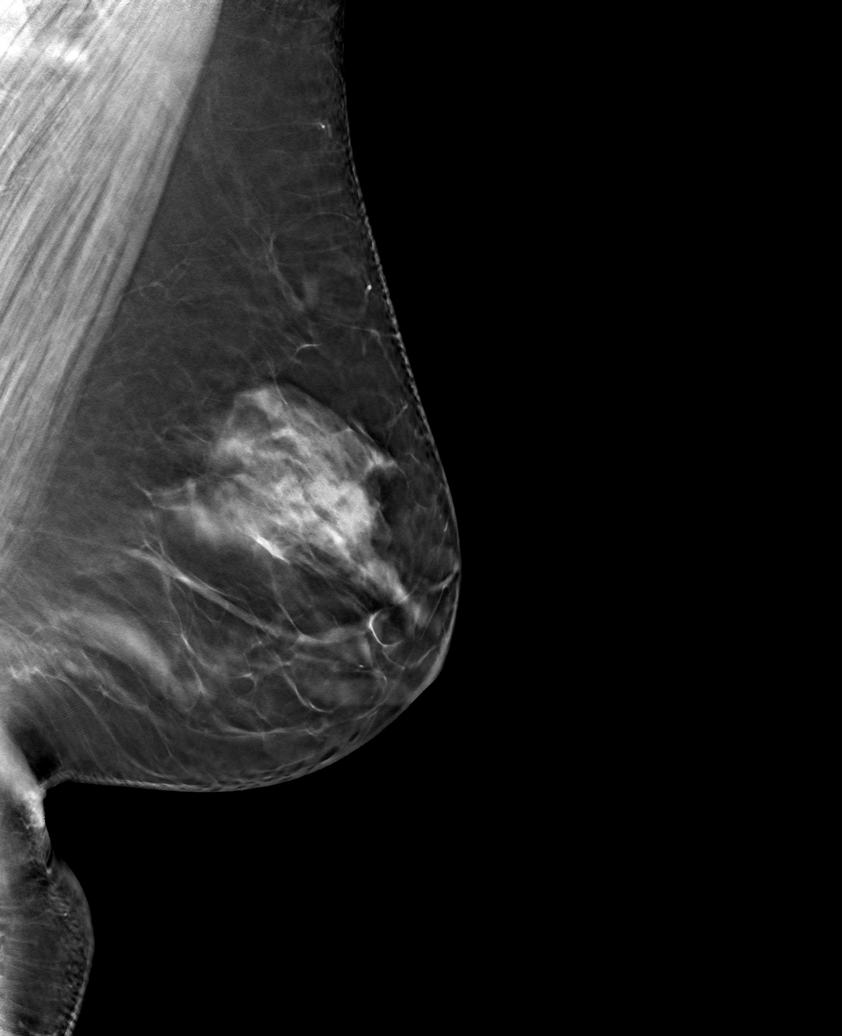

[6 of 30 positions shown; findings below may reference images not displayed]

ACR Breast Density Category c: The breast tissue is heterogeneously
dense, which may obscure small masses.
FINDINGS: There are no findings suspicious for malignancy. Images were
processed with CAD.
IMPRESSION: No mammographic evidence of malignancy. A result letter of this
screening mammogram will be mailed directly to the patient.

RECOMMENDATION:
Screening mammogram in one year. (Code:FT-U-LHB)

BI-RADS CATEGORY  1: Negative.

## 2021-12-10 ENCOUNTER — Encounter (INDEPENDENT_AMBULATORY_CARE_PROVIDER_SITE_OTHER): Payer: Self-pay

## 2022-01-14 ENCOUNTER — Other Ambulatory Visit: Payer: Self-pay | Admitting: Pediatrics

## 2022-01-14 DIAGNOSIS — Z1231 Encounter for screening mammogram for malignant neoplasm of breast: Secondary | ICD-10-CM

## 2022-03-04 ENCOUNTER — Ambulatory Visit
Admission: RE | Admit: 2022-03-04 | Discharge: 2022-03-04 | Disposition: A | Discharge status: Home / Self Care | Payer: BC Managed Care – PPO | Source: Ambulatory Visit | Attending: Pediatrics | Admitting: Pediatrics

## 2022-03-04 DIAGNOSIS — Z1231 Encounter for screening mammogram for malignant neoplasm of breast: Secondary | ICD-10-CM | POA: Diagnosis not present

## 2023-03-18 ENCOUNTER — Other Ambulatory Visit: Payer: Self-pay | Admitting: Pediatrics

## 2023-03-18 DIAGNOSIS — Z1231 Encounter for screening mammogram for malignant neoplasm of breast: Secondary | ICD-10-CM

## 2023-04-07 ENCOUNTER — Ambulatory Visit
Admission: RE | Admit: 2023-04-07 | Discharge: 2023-04-07 | Disposition: A | Discharge status: Home / Self Care | Payer: 59 | Source: Ambulatory Visit | Attending: Pediatrics | Admitting: Pediatrics

## 2023-04-07 DIAGNOSIS — Z1231 Encounter for screening mammogram for malignant neoplasm of breast: Secondary | ICD-10-CM | POA: Diagnosis present
